# Patient Record
Sex: Male | Born: 1970 | Race: Black or African American | Hispanic: No | Marital: Single | Smoking: Current every day smoker
Health system: Southern US, Community
[De-identification: ages and names within clinical notes are randomized; demographics above are authoritative.]

---

## 1999-09-18 ENCOUNTER — Emergency Department (HOSPITAL_COMMUNITY): Admission: EM | Admit: 1999-09-18 | Discharge: 1999-09-18 | Payer: Self-pay | Admitting: Emergency Medicine

## 2000-07-04 ENCOUNTER — Emergency Department (HOSPITAL_COMMUNITY): Admission: EM | Admit: 2000-07-04 | Discharge: 2000-07-04 | Payer: Self-pay | Admitting: *Deleted

## 2002-09-04 ENCOUNTER — Emergency Department (HOSPITAL_COMMUNITY): Admission: EM | Admit: 2002-09-04 | Discharge: 2002-09-04 | Payer: Self-pay | Admitting: Emergency Medicine

## 2002-10-30 ENCOUNTER — Emergency Department (HOSPITAL_COMMUNITY): Admission: EM | Admit: 2002-10-30 | Discharge: 2002-10-30 | Payer: Self-pay | Admitting: Emergency Medicine

## 2002-12-03 ENCOUNTER — Emergency Department (HOSPITAL_COMMUNITY): Admission: EM | Admit: 2002-12-03 | Discharge: 2002-12-03 | Payer: Self-pay | Admitting: Emergency Medicine

## 2002-12-10 ENCOUNTER — Emergency Department (HOSPITAL_COMMUNITY): Admission: EM | Admit: 2002-12-10 | Discharge: 2002-12-10 | Payer: Self-pay | Admitting: Emergency Medicine

## 2003-02-19 ENCOUNTER — Emergency Department (HOSPITAL_COMMUNITY): Admission: EM | Admit: 2003-02-19 | Discharge: 2003-02-19 | Payer: Self-pay | Admitting: Emergency Medicine

## 2003-05-20 ENCOUNTER — Emergency Department (HOSPITAL_COMMUNITY): Admission: EM | Admit: 2003-05-20 | Discharge: 2003-05-20 | Payer: Self-pay | Admitting: Emergency Medicine

## 2003-05-21 ENCOUNTER — Emergency Department (HOSPITAL_COMMUNITY): Admission: EM | Admit: 2003-05-21 | Discharge: 2003-05-21 | Payer: Self-pay | Admitting: Emergency Medicine

## 2003-05-22 ENCOUNTER — Emergency Department (HOSPITAL_COMMUNITY): Admission: EM | Admit: 2003-05-22 | Discharge: 2003-05-22 | Payer: Self-pay | Admitting: Emergency Medicine

## 2003-06-30 ENCOUNTER — Emergency Department (HOSPITAL_COMMUNITY): Admission: EM | Admit: 2003-06-30 | Discharge: 2003-07-01 | Payer: Self-pay | Admitting: Emergency Medicine

## 2003-08-26 ENCOUNTER — Emergency Department (HOSPITAL_COMMUNITY): Admission: EM | Admit: 2003-08-26 | Discharge: 2003-08-26 | Payer: Self-pay | Admitting: Emergency Medicine

## 2003-10-01 ENCOUNTER — Emergency Department (HOSPITAL_COMMUNITY): Admission: EM | Admit: 2003-10-01 | Discharge: 2003-10-01 | Payer: Self-pay | Admitting: Family Medicine

## 2003-10-07 ENCOUNTER — Emergency Department (HOSPITAL_COMMUNITY): Admission: EM | Admit: 2003-10-07 | Discharge: 2003-10-08 | Payer: Self-pay | Admitting: Emergency Medicine

## 2003-12-07 ENCOUNTER — Emergency Department (HOSPITAL_COMMUNITY): Admission: EM | Admit: 2003-12-07 | Discharge: 2003-12-07 | Payer: Self-pay | Admitting: Family Medicine

## 2003-12-08 ENCOUNTER — Emergency Department (HOSPITAL_COMMUNITY): Admission: EM | Admit: 2003-12-08 | Discharge: 2003-12-08 | Payer: Self-pay | Admitting: Emergency Medicine

## 2010-08-20 ENCOUNTER — Emergency Department (HOSPITAL_COMMUNITY)
Admission: EM | Admit: 2010-08-20 | Discharge: 2010-08-20 | Disposition: A | Discharge status: Home / Self Care | Payer: Self-pay | Attending: Emergency Medicine | Admitting: Emergency Medicine

## 2010-08-20 DIAGNOSIS — M545 Low back pain, unspecified: Secondary | ICD-10-CM | POA: Insufficient documentation

## 2010-08-20 DIAGNOSIS — S335XXA Sprain of ligaments of lumbar spine, initial encounter: Secondary | ICD-10-CM | POA: Insufficient documentation

## 2010-08-20 DIAGNOSIS — R296 Repeated falls: Secondary | ICD-10-CM | POA: Insufficient documentation

## 2010-08-20 DIAGNOSIS — X500XXA Overexertion from strenuous movement or load, initial encounter: Secondary | ICD-10-CM | POA: Insufficient documentation

## 2010-10-14 ENCOUNTER — Emergency Department (HOSPITAL_COMMUNITY)
Admission: EM | Admit: 2010-10-14 | Discharge: 2010-10-14 | Discharge status: Against Medical Advice | Payer: Self-pay | Attending: Emergency Medicine | Admitting: Emergency Medicine

## 2010-10-14 DIAGNOSIS — G8929 Other chronic pain: Secondary | ICD-10-CM | POA: Insufficient documentation

## 2010-10-14 DIAGNOSIS — M549 Dorsalgia, unspecified: Secondary | ICD-10-CM | POA: Insufficient documentation

## 2011-05-20 ENCOUNTER — Encounter (HOSPITAL_COMMUNITY): Payer: Self-pay | Admitting: *Deleted

## 2011-05-20 ENCOUNTER — Emergency Department (HOSPITAL_COMMUNITY)
Admission: EM | Admit: 2011-05-20 | Discharge: 2011-05-20 | Disposition: A | Discharge status: Home / Self Care | Payer: Self-pay | Attending: Emergency Medicine | Admitting: Emergency Medicine

## 2011-05-20 ENCOUNTER — Emergency Department (HOSPITAL_COMMUNITY): Payer: Self-pay

## 2011-05-20 ENCOUNTER — Other Ambulatory Visit: Payer: Self-pay

## 2011-05-20 DIAGNOSIS — R209 Unspecified disturbances of skin sensation: Secondary | ICD-10-CM | POA: Insufficient documentation

## 2011-05-20 DIAGNOSIS — R0789 Other chest pain: Secondary | ICD-10-CM | POA: Insufficient documentation

## 2011-05-20 DIAGNOSIS — R202 Paresthesia of skin: Secondary | ICD-10-CM

## 2011-05-20 DIAGNOSIS — R200 Anesthesia of skin: Secondary | ICD-10-CM

## 2011-05-20 LAB — URINALYSIS, ROUTINE W REFLEX MICROSCOPIC
Bilirubin Urine: NEGATIVE
Glucose, UA: NEGATIVE mg/dL
Hgb urine dipstick: NEGATIVE
Ketones, ur: NEGATIVE mg/dL
Leukocytes, UA: NEGATIVE
Nitrite: NEGATIVE
Protein, ur: NEGATIVE mg/dL
Specific Gravity, Urine: 1.025 (ref 1.005–1.030)
Urobilinogen, UA: 1 mg/dL (ref 0.0–1.0)
pH: 5.5 (ref 5.0–8.0)

## 2011-05-20 LAB — BASIC METABOLIC PANEL
BUN: 10 mg/dL (ref 6–23)
CO2: 21 mEq/L (ref 19–32)
Calcium: 10.3 mg/dL (ref 8.4–10.5)
Chloride: 103 mEq/L (ref 96–112)
Creatinine, Ser: 0.93 mg/dL (ref 0.50–1.35)
GFR calc Af Amer: >90 mL/min (ref >90)
GFR calc non Af Amer: >90 mL/min (ref >90)
Glucose, Bld: 100 mg/dL — ABNORMAL HIGH (ref 70–99)
Potassium: 4.6 mEq/L (ref 3.5–5.1)
Sodium: 136 mEq/L (ref 135–145)

## 2011-05-20 LAB — DIFFERENTIAL
Basophils Absolute: 0 10*3/uL (ref 0.0–0.1)
Basophils Relative: 1 % (ref 0–1)
Eosinophils Absolute: 0.1 10*3/uL (ref 0.0–0.7)
Eosinophils Relative: 2 % (ref 0–5)
Lymphocytes Relative: 41 % (ref 12–46)
Lymphs Abs: 2.4 10*3/uL (ref 0.7–4.0)
Monocytes Absolute: 0.4 10*3/uL (ref 0.1–1.0)
Monocytes Relative: 7 % (ref 3–12)
Neutro Abs: 2.9 10*3/uL (ref 1.7–7.7)
Neutrophils Relative %: 50 % (ref 43–77)

## 2011-05-20 LAB — CBC
HCT: 43.7 % (ref 39.0–52.0)
Hemoglobin: 14.5 g/dL (ref 13.0–17.0)
MCH: 32.1 pg (ref 26.0–34.0)
MCHC: 33.2 g/dL (ref 30.0–36.0)
MCV: 96.7 fL (ref 78.0–100.0)
Platelets: 310 10*3/uL (ref 150–400)
RBC: 4.52 MIL/uL (ref 4.22–5.81)
RDW: 14.3 % (ref 11.5–15.5)
WBC: 5.9 10*3/uL (ref 4.0–10.5)

## 2011-05-20 LAB — TROPONIN I
Troponin I: <0.3 ng/mL (ref <0.30)
Troponin I: <0.3 ng/mL (ref <0.30)

## 2011-05-20 MED ORDER — ASPIRIN 81 MG PO CHEW
324.0000 mg | CHEWABLE_TABLET | Freq: Once | ORAL | Status: AC
  Administered 2011-05-20: 324 mg via ORAL
  Filled 2011-05-20: qty 4

## 2011-05-20 MED ORDER — HYDROCODONE-ACETAMINOPHEN 5-325 MG PO TABS: 1.0000 | ORAL_TABLET | Freq: Four times a day (QID) | ORAL | Status: AC | PRN

## 2011-05-20 MED ORDER — PREDNISONE 50 MG PO TABS: 50.0000 mg | ORAL_TABLET | Freq: Every day | ORAL | Status: AC

## 2011-05-20 NOTE — ED Provider Notes (Signed)
History     CSN: [id number]  Arrival date & time 05/20/11  1419   First MD Initiated Contact with Patient 05/20/11 1511      Chief Complaint  Patient presents with  . Chest Pain    (Consider location/radiation/quality/duration/timing/severity/associated sxs/prior treatment) HPI The patient presents to the ER with a 3-4 week history of tingling into both hands and arms with occasional pain in his chest. 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 He states that there is no pattern to the pain/tignling. He states that exertion does not reproduce or worsen the issue. He states that he has had no sob, weakness, N/V, dizziness, visual changes, sweating, headache, back pain, or fever. The patient denies palpitations as well. History reviewed. No pertinent past medical history.  History reviewed. No pertinent past surgical history.  History reviewed. No pertinent family history.  History  Substance Use Topics  . Smoking status: Current Everyday Smoker -- 0.5 packs/day    Types: Cigarettes  . Smokeless tobacco: Not on file  . Alcohol Use: No      Review of Systems All pertinent positives and negatives reviewed in the history of present illness   Allergies  Review of patient's allergies indicates no known allergies.  Home Medications   Current Outpatient Rx  Name Route Sig Dispense Refill  . TYLENOL PM EXTRA STRENGTH PO Oral Take 1 tablet by mouth at bedtime.      BP 136/52  Pulse 93  Temp(Src) 98.1 F (36.7 C) (Oral)  Resp 19  SpO2 97%  Physical Exam  Constitutional: He is oriented to person, place, and time. He appears well-developed and well-nourished. No distress.  HENT:  Head: Normocephalic and atraumatic.  Eyes: EOM are normal. Pupils are equal, round, and reactive to light.  Neck: Normal range of motion. Neck supple.  Cardiovascular: Normal rate, regular rhythm  and normal heart sounds.  Exam reveals no gallop and no friction rub.   No murmur heard. Pulmonary/Chest: Effort normal and breath sounds normal. No respiratory distress. He has no wheezes. He has no rales.  Abdominal: Soft. Bowel sounds are normal. He exhibits no distension. There is no tenderness. There is no rebound and no guarding.  Musculoskeletal:       The patient has normal ROM and grips bilaterally. The patient also has normal DTRs.   Neurological: He is alert and oriented to person, place, and time.  Skin: Skin is warm and dry. No rash noted.    ED Course  Procedures (including critical care time)   Labs Reviewed  TROPONIN I  CBC  DIFFERENTIAL  BASIC METABOLIC PANEL  URINALYSIS, ROUTINE W REFLEX MICROSCOPIC   Dg Chest 2 View  05/20/2011  *RADIOLOGY REPORT*  Clinical Data: Left-sided chest pain.  History of smoking.  CHEST - 2 VIEW  Comparison: None.  Findings: Cardiomediastinal silhouette is within normal limits. The lungs are free of focal consolidations and pleural effusions. Visualized osseous structures have a normal appearance.  IMPRESSION: Negative exam.  Original Report Authenticated By: Patterson Hammersmith, M.D.     3:46 PM Patient has been seen, assessed, current plan given, questions answered and needs addressed at this time.  The patient does mention that he has had a sensation of a "crick" in his neck over this same time frame. 7:56 PM The patient has had 2 sets of negative cardiac enzymes here in the emergency  department.  There are changes noted on his EKG especially inV 3.  This is most consistent with J-point elevation rather than ST segment elevation.  There is some flipped T waves diffusely.  The patient's chest pain is atypical in nature for cardiac type pain based on his history of present illness and physical exam findings.  The patient will be referred to cardiology.  We have done several serial EKGs here in the emergency department with no changes.   Patient has been followed along with Dr.Kohut.  He agrees with the statements about the EKG.  I still feel there is a radicular component to the patient's arm pain and tingling.  This is bilateral in nature and he describes a sensation of into his trapezius muscle. Patient has been pain free here as well. Wellens is considered but these ECGs don't meet those criteria (based on V3) MDM  MDM Reviewed: nursing note and vitals Interpretation: labs, ECG and x-ray    Date: 05/20/2011 14:27  Rate:98  Rhythm: normal sinus rhythm  QRS Axis: normal  Intervals: normal  ST/T Wave abnormalities: nonspecific T wave changes  Conduction Disutrbances:none  Narrative Interpretation: see above comments  Old EKG Reviewed: none available   Date: 05/20/2011 1732  Rate: 83  Rhythm: normal sinus rhythm  QRS Axis: normal  Intervals: normal  ST/T Wave abnormalities: nonspecific T wave changes  Conduction Disutrbances:none  Narrative Interpretation: see above comments  Old EKG Reviewed: unchanged   Date: 05/20/2011 20:02  Rate: 80  Rhythm: normal sinus rhythm  QRS Axis: normal  Intervals: normal  ST/T Wave abnormalities: nonspecific T wave changes  Conduction Disutrbances:none  Narrative Interpretation:   Old EKG Reviewed: unchanged Dr. Juleen China reviewed all ecgs as well.           Carlyle Dolly, PA-C 05/20/11 2011

## 2011-05-20 NOTE — ED Notes (Signed)
Reports intermittent left side chest pains x 3 weeks, now radiates down left arm. ekg done at triage, no acute distress noted.

## 2011-05-20 NOTE — Discharge Instructions (Signed)
Follow-up with the cardiologist provided.  Return here for any worsening in your condition. °

## 2011-05-29 NOTE — ED Provider Notes (Signed)
Medical screening examination/treatment/procedure(s) were conducted as a shared visit with non-physician practitioner(s) and myself.  I personally evaluated the patient during the encounter  Raeford Razor, MD 05/29/11 1909

## 2013-08-24 IMAGING — CR DG CHEST 2V
2 series · 2 of 2 positions shown · non-contrast
Comparison: None.

CLINICAL DATA: Left-sided chest pain.  History of smoking.

CHEST - 2 VIEW

[w chest pa]
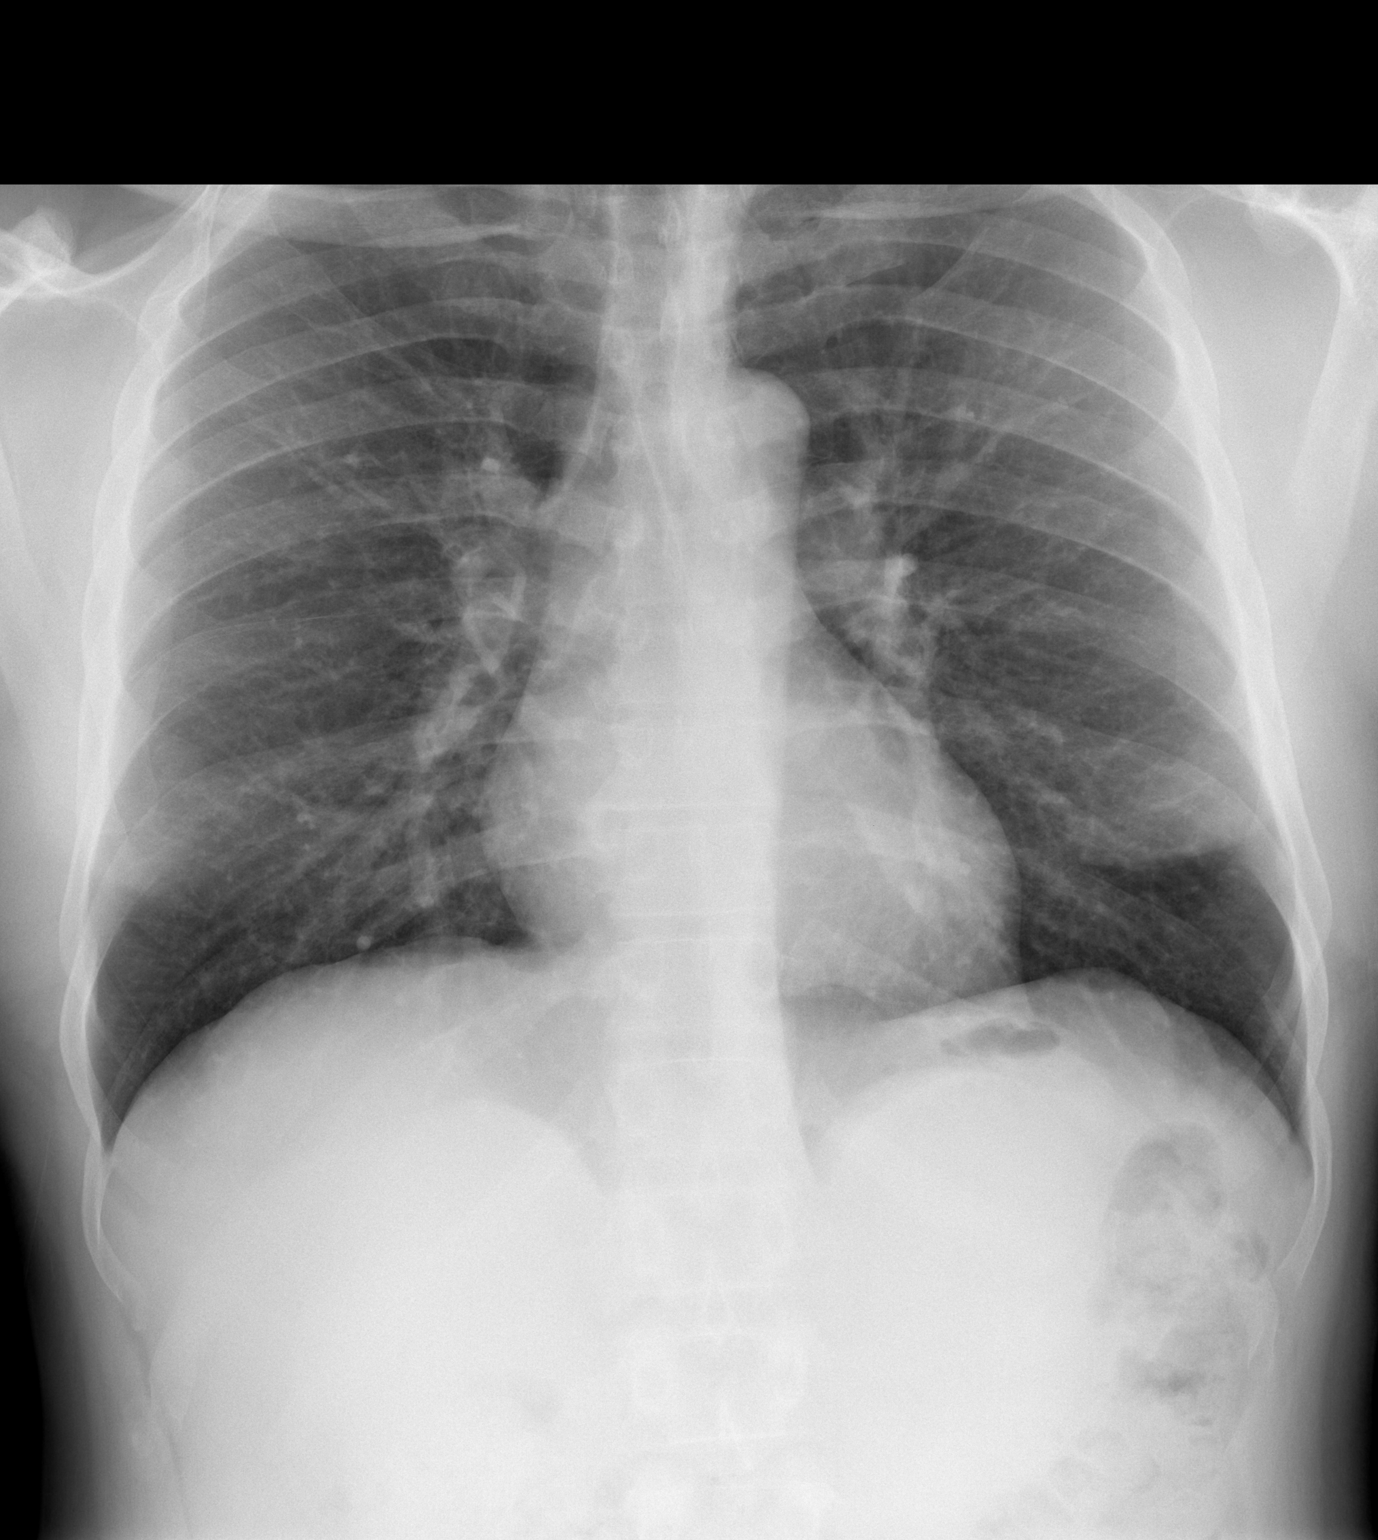

[w chest lat]
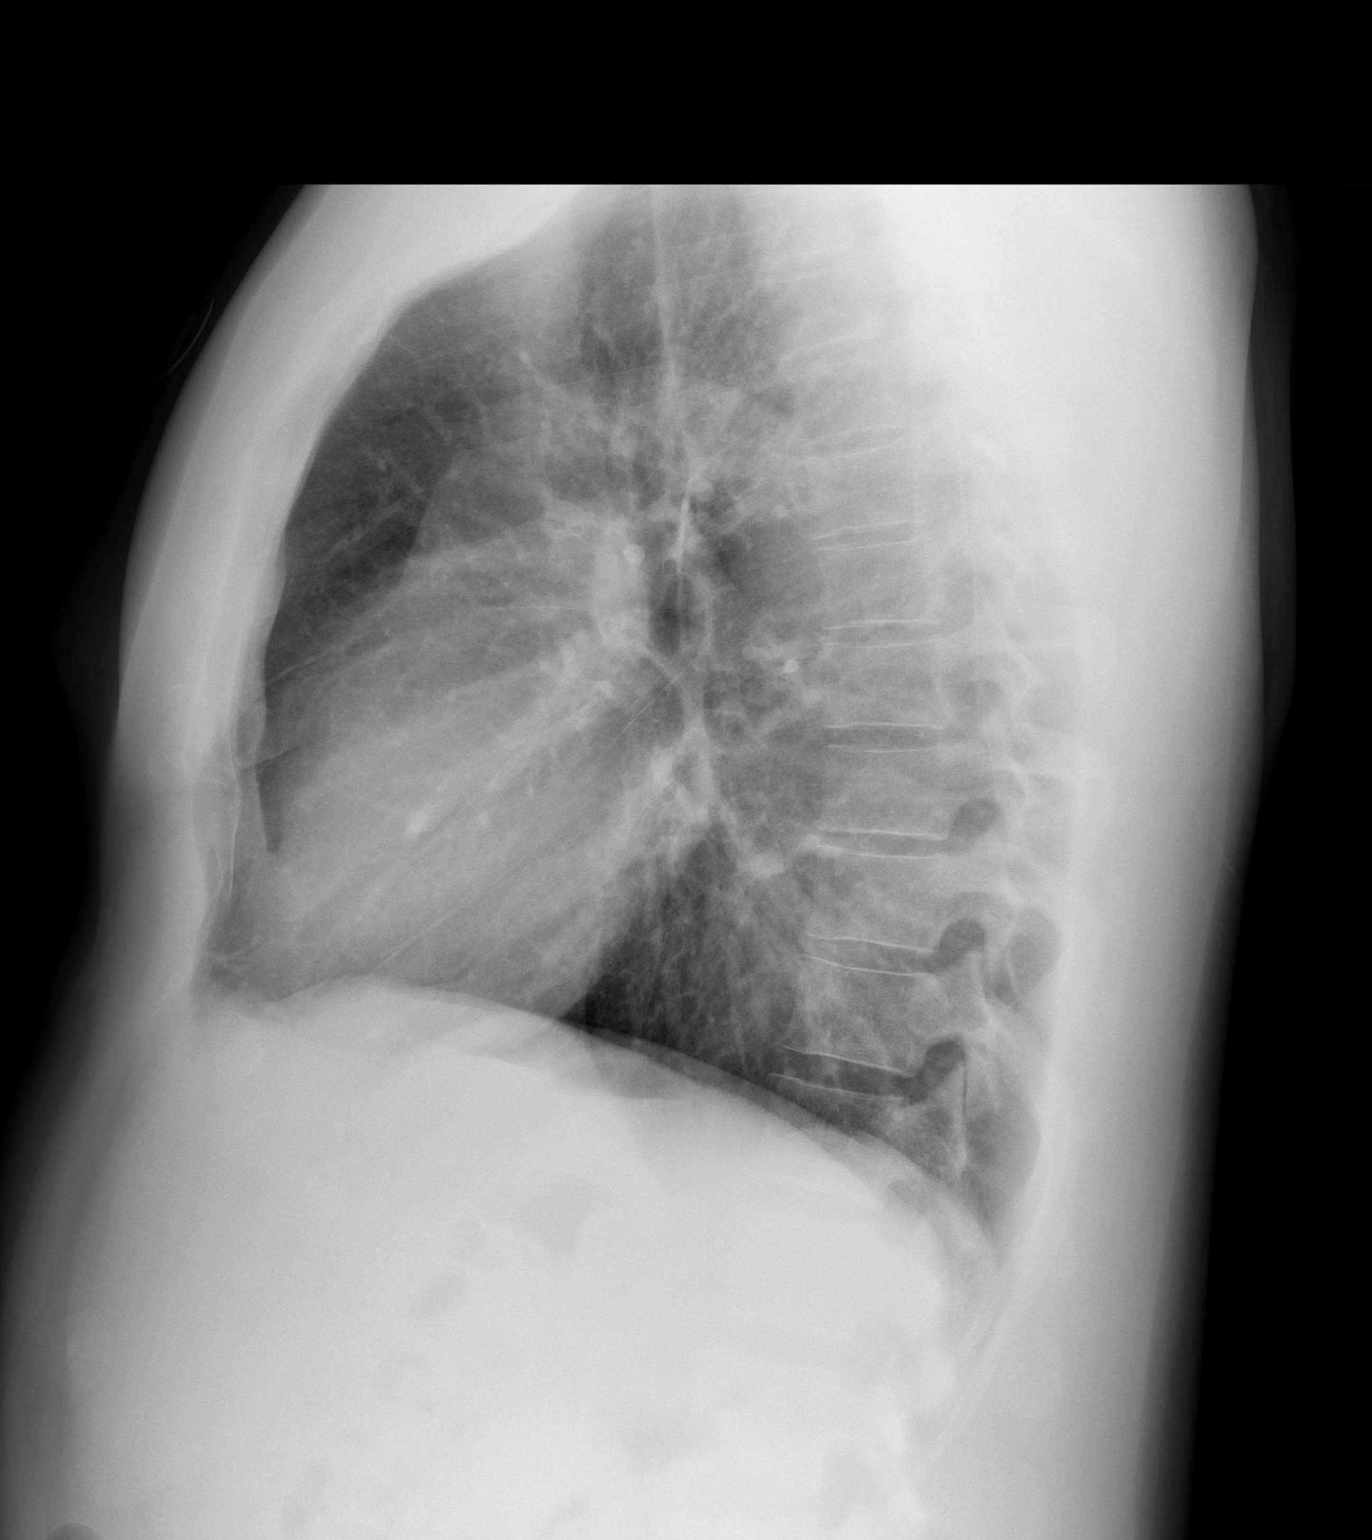

[2 of 2 positions shown; findings below may reference images not displayed]

FINDINGS: Cardiomediastinal silhouette is within normal limits.
The lungs are free of focal consolidations and pleural effusions.
Visualized osseous structures have a normal appearance.
IMPRESSION: Negative exam.

## 2013-11-14 ENCOUNTER — Emergency Department (HOSPITAL_COMMUNITY)
Admission: EM | Admit: 2013-11-14 | Discharge: 2013-11-14 | Disposition: A | Discharge status: Home / Self Care | Payer: Self-pay | Attending: Emergency Medicine | Admitting: Emergency Medicine

## 2013-11-14 ENCOUNTER — Encounter (HOSPITAL_COMMUNITY): Payer: Self-pay | Admitting: Emergency Medicine

## 2013-11-14 DIAGNOSIS — Z79899 Other long term (current) drug therapy: Secondary | ICD-10-CM | POA: Insufficient documentation

## 2013-11-14 DIAGNOSIS — F17200 Nicotine dependence, unspecified, uncomplicated: Secondary | ICD-10-CM | POA: Insufficient documentation

## 2013-11-14 DIAGNOSIS — Z008 Encounter for other general examination: Secondary | ICD-10-CM | POA: Insufficient documentation

## 2013-11-14 DIAGNOSIS — F1410 Cocaine abuse, uncomplicated: Secondary | ICD-10-CM | POA: Insufficient documentation

## 2013-11-14 NOTE — Discharge Instructions (Signed)
Chemical Dependency  Chemical dependency is an addiction to drugs or alcohol. It is characterized by the repeated behavior of seeking out and using drugs and alcohol despite harmful consequences to the health and safety of ones self and others.   RISK FACTORS  There are certain situations or behaviors that increase a person's risk for chemical dependency. These include:  · A family history of chemical dependency.  · A history of mental health issues, including depression and anxiety.  · A home environment where drugs and alcohol are easily available to you.  · Drug or alcohol use at a young age.  SYMPTOMS   The following symptoms can indicate chemical dependency:  · Inability to limit the use of drugs or alcohol.  · Nausea, sweating, shakiness, and anxiety that occurs when alcohol or drugs are not being used.  · An increase in amount of drugs or alcohol that is necessary to get drunk or high.  People who experience these symptoms can assess their use of drugs and alcohol by asking themselves the following questions:  · Have you been told by friends or family that they are worried about your use of alcohol or drugs?  · Do friends and family ever tell you about things you did while drinking alcohol or using drugs that you do not remember?  · Do you lie about using alcohol or drugs or about the amounts you use?  · Do you have difficulty completing daily tasks unless you use alcohol or drugs?  · Is the level of your work or school performance lower because of your drug or alcohol use?  · Do you get sick from using drugs or alcohol but keep using anyway?  · Do you feel uncomfortable in social situations unless you use alcohol or drugs?  · Do you use drugs or alcohol to help forget problems?   An answer of yes to any of these questions may indicate chemical dependency. Professional evaluation is suggested.  Document Released: 02/28/2001 Document Revised: 05/29/2011 Document Reviewed: 05/12/2010  [employer]® Patient  Information ©2015 [employer]. This information is not intended to replace advice given to you by your health care provider. Make sure you discuss any questions you have with your health care provider.

## 2013-11-14 NOTE — ED Provider Notes (Signed)
CSN: [id number]     Arrival date & time 11/14/13  1338 History  This chart was scribed for non-physician practitioner, Arthor Captain, PA-C working with Glynn Octave, MD by Greggory Stallion, ED scribe. This patient was seen in room C28C/C28C and the patient's care was started at 3:49 PM.   Chief Complaint  Patient presents with  . Drug Problem  . Medical Clearance   The history is provided by the patient. No language interpreter was used.   HPI Comments: Scott French is a 43 y.o. male who presents to the Emergency Department requesting medical clearance. States he needs detox for cocaine and has already spoken with [facility]. Pt states that he was told to get clearance and then he could go over there for in-patient treatment. His last cocaine use was yesterday. He states that he has had a problem with cocaine for about one year. Denies SI/HI.   History reviewed. No pertinent past medical history. History reviewed. No pertinent past surgical history. History reviewed. No pertinent family history. History  Substance Use Topics  . Smoking status: Current Every Day Smoker -- 0.50 packs/day    Types: Cigarettes  . Smokeless tobacco: Not on file  . Alcohol Use: Yes     Comment: occ    Review of Systems  A complete 10 system review of systems was obtained and all systems are negative except as noted in the HPI and PMH.   Allergies  Review of patient's allergies indicates no known allergies.  Home Medications   Prior to Admission medications   Medication Sig Start Date End Date Taking? Authorizing Provider  Diphenhydramine-APAP, sleep, (TYLENOL PM EXTRA STRENGTH PO) Take 1 tablet by mouth at bedtime.    Historical Provider, MD   BP 131/72  Pulse 87  Temp(Src) 98.7 F (37.1 C) (Oral)  Resp 18  Ht  (1.803 m)  Wt 210 lb (95.255 kg)  BMI 29.30 kg/m2  SpO2 94%  Physical Exam  Nursing note and vitals reviewed. Constitutional: He is oriented to person, place, and time. He  appears well-developed and well-nourished. No distress.  HENT:  Head: Normocephalic and atraumatic.  Eyes: Conjunctivae and EOM are normal.  Neck: Neck supple. No tracheal deviation present.  Cardiovascular: Normal rate.   Pulmonary/Chest: Effort normal. No respiratory distress.  Musculoskeletal: Normal range of motion.  Neurological: He is alert and oriented to person, place, and time.  Skin: Skin is warm and dry.  Psychiatric: He has a normal mood and affect. His behavior is normal.    ED Course  Procedures (including critical care time)  DIAGNOSTIC STUDIES: Oxygen Saturation is 94% on RA, adequate by my interpretation.    COORDINATION OF CARE: 3:52 PM-Discussed treatment plan which includes lab work with pt at bedside and pt agreed to plan.   Labs Review Labs Reviewed - No data to display  Imaging Review No results found.   EKG Interpretation None      MDM   Final diagnoses:  Cocaine abuse   BP 130/77  Pulse 85  Temp(Src) 98.5 F (36.9 C) (Oral)  Resp 16  Ht  (1.803 m)  Wt 210 lb (95.255 kg)  BMI 29.30 kg/m2  SpO2 97%  I spent approx 20 minutes on the phone with both [facility] and [facility] inpatient service. I was able to secure a screening exam with [facility] at 8:00 AM Tuesday 11/18/2013. I explained to the patient that a medical screening exam done outside of the 24 hour window would not be  accepted. I also explained to new ED policies regarding substance abuse detox. THe patient expresses his understanding. He denies SI, HI, AVH.   Patient / Family / Caregiver informed of clinical course, understand medical decision-making process, and agree with plan.  I personally performed the services described in this documentation, which was scribed in my presence. The recorded information has been reviewed and is accurate.    I personally performed the services described in this documentation, which was scribed in my presence. The recorded information has been  reviewed and is accurate.  Arthor Captain, PA-C 11/16/13 [phone]

## 2013-11-14 NOTE — ED Notes (Signed)
Pt requesting detox from cocaine, last used yesterday. No complaints and denies SI or HI.

## 2013-11-16 NOTE — ED Provider Notes (Signed)
Medical screening examination/treatment/procedure(s) were performed by non-physician practitioner and as supervising physician I was immediately available for consultation/collaboration.   EKG Interpretation None        Maritta Kief, MD 11/16/13 0658 

## 2014-01-01 ENCOUNTER — Emergency Department (HOSPITAL_BASED_OUTPATIENT_CLINIC_OR_DEPARTMENT_OTHER)
Admission: EM | Admit: 2014-01-01 | Discharge: 2014-01-01 | Disposition: A | Discharge status: Home / Self Care | Payer: Self-pay | Attending: Emergency Medicine | Admitting: Emergency Medicine

## 2014-01-01 ENCOUNTER — Encounter (HOSPITAL_BASED_OUTPATIENT_CLINIC_OR_DEPARTMENT_OTHER): Payer: Self-pay | Admitting: Emergency Medicine

## 2014-01-01 DIAGNOSIS — H00014 Hordeolum externum left upper eyelid: Secondary | ICD-10-CM | POA: Insufficient documentation

## 2014-01-01 DIAGNOSIS — Z720 Tobacco use: Secondary | ICD-10-CM | POA: Insufficient documentation

## 2014-01-01 DIAGNOSIS — H00016 Hordeolum externum left eye, unspecified eyelid: Secondary | ICD-10-CM

## 2014-01-01 NOTE — ED Notes (Signed)
C/o pain to left upper eye lid x 6 days

## 2014-01-01 NOTE — ED Provider Notes (Signed)
Medical screening examination/treatment/procedure(s) were performed by non-physician practitioner and as supervising physician I was immediately available for consultation/collaboration.    Nelia Shiobert L Nikolay Demetriou, MD 01/01/14 [phone]

## 2014-01-01 NOTE — Discharge Instructions (Signed)

## 2014-01-01 NOTE — ED Provider Notes (Signed)
CSN: [id number]     Arrival date & time 01/01/14  1541 History   First MD Initiated Contact with Patient 01/01/14 1551     Chief Complaint  Patient presents with  . Eye Pain     (Consider location/radiation/quality/duration/timing/severity/associated sxs/prior Treatment) HPI Comments: Pt c/o discomfort and a bump to the left upper eye lid. No drainage from the area. No visual changes.denies any eye redness  The history is provided by the patient. No language interpreter was used.    History reviewed. No pertinent past medical history. History reviewed. No pertinent past surgical history. No family history on file. History  Substance Use Topics  . Smoking status: Current Every Day Smoker  . Smokeless tobacco: Not on file  . Alcohol Use: No    Review of Systems  Constitutional: Negative.   Respiratory: Negative.   Cardiovascular: Negative.       Allergies  Review of patient's allergies indicates no known allergies.  Home Medications   Prior to Admission medications   Medication Sig Start Date End Date Taking? Authorizing Provider  Citalopram Hydrobromide (CELEXA PO) Take by mouth.   Yes Historical Provider, MD  QUEtiapine Fumarate (SEROQUEL PO) Take by mouth.   Yes Historical Provider, MD   BP 156/95  Pulse 83  Temp(Src) 98.2 F (36.8 C) (Oral)  Resp 16  Ht 5\' 11"  (1.803 m)  Wt 214 lb (97.07 kg)  BMI 29.86 kg/m2  SpO2 98% Physical Exam  Nursing note and vitals reviewed. Constitutional: He appears well-developed and well-nourished.  HENT:  Right Ear: External ear normal.  Left Ear: External ear normal.  Eyes: Conjunctivae and EOM are normal. Pupils are equal, round, and reactive to light.    Stye noted to the area  Cardiovascular: Normal rate and regular rhythm.   Pulmonary/Chest: Effort normal and breath sounds normal.    ED Course  Procedures (including critical care time) Labs Review Labs Reviewed - No data to display  Imaging Review No results  found.   EKG Interpretation None      MDM   Final diagnoses:  Stye, left   Discussed treatment for stye    Teressa LowerVrinda Leo Fray, NP 01/01/14 1609

## 2014-01-06 ENCOUNTER — Encounter (HOSPITAL_COMMUNITY): Payer: Self-pay | Admitting: Emergency Medicine
